# Patient Record
Sex: Female | Born: 1970 | Race: White | Hispanic: No | Smoking: Current some day smoker
Health system: Southern US, Community
[De-identification: ages and names within clinical notes are randomized; demographics above are authoritative.]

## PROBLEM LIST (undated history)

## (undated) DIAGNOSIS — Q775 Diastrophic dysplasia: Secondary | ICD-10-CM

## (undated) HISTORY — DX: Diastrophic dysplasia: Q77.5

## (undated) HISTORY — PX: LAMINECTOMY: SHX219

---

## 2013-03-26 ENCOUNTER — Other Ambulatory Visit (HOSPITAL_COMMUNITY): Payer: Self-pay | Admitting: Family Medicine

## 2013-03-26 ENCOUNTER — Ambulatory Visit (HOSPITAL_COMMUNITY)
Admission: RE | Admit: 2013-03-26 | Discharge: 2013-03-26 | Disposition: A | Discharge status: Home / Self Care | Payer: BC Managed Care – PPO | Source: Ambulatory Visit | Attending: Family Medicine | Admitting: Family Medicine

## 2013-03-26 DIAGNOSIS — G8929 Other chronic pain: Secondary | ICD-10-CM

## 2013-03-26 DIAGNOSIS — M25519 Pain in unspecified shoulder: Secondary | ICD-10-CM | POA: Insufficient documentation

## 2013-03-26 DIAGNOSIS — IMO0002 Reserved for concepts with insufficient information to code with codable children: Secondary | ICD-10-CM | POA: Insufficient documentation

## 2013-03-26 DIAGNOSIS — M25569 Pain in unspecified knee: Secondary | ICD-10-CM | POA: Insufficient documentation

## 2013-03-26 DIAGNOSIS — Q775 Diastrophic dysplasia: Secondary | ICD-10-CM

## 2013-03-26 DIAGNOSIS — M1710 Unilateral primary osteoarthritis, unspecified knee: Secondary | ICD-10-CM | POA: Insufficient documentation

## 2013-03-26 DIAGNOSIS — Q6589 Other specified congenital deformities of hip: Secondary | ICD-10-CM | POA: Insufficient documentation

## 2013-03-26 DIAGNOSIS — Z1231 Encounter for screening mammogram for malignant neoplasm of breast: Secondary | ICD-10-CM

## 2013-03-26 DIAGNOSIS — M19019 Primary osteoarthritis, unspecified shoulder: Secondary | ICD-10-CM | POA: Insufficient documentation

## 2013-03-26 DIAGNOSIS — M25559 Pain in unspecified hip: Secondary | ICD-10-CM | POA: Insufficient documentation

## 2013-05-17 ENCOUNTER — Ambulatory Visit (INDEPENDENT_AMBULATORY_CARE_PROVIDER_SITE_OTHER): Payer: BC Managed Care – PPO | Admitting: Orthopedic Surgery

## 2013-05-17 ENCOUNTER — Encounter: Payer: Self-pay | Admitting: Orthopedic Surgery

## 2013-05-17 VITALS — BP 108/85 | Ht <= 58 in | Wt 97.0 lb

## 2013-05-17 DIAGNOSIS — M719 Bursopathy, unspecified: Secondary | ICD-10-CM

## 2013-05-17 DIAGNOSIS — M87029 Idiopathic aseptic necrosis of unspecified humerus: Secondary | ICD-10-CM

## 2013-05-17 DIAGNOSIS — M19019 Primary osteoarthritis, unspecified shoulder: Secondary | ICD-10-CM | POA: Insufficient documentation

## 2013-05-17 DIAGNOSIS — M75101 Unspecified rotator cuff tear or rupture of right shoulder, not specified as traumatic: Secondary | ICD-10-CM | POA: Insufficient documentation

## 2013-05-17 DIAGNOSIS — Q788 Other specified osteochondrodysplasias: Secondary | ICD-10-CM

## 2013-05-17 DIAGNOSIS — M67919 Unspecified disorder of synovium and tendon, unspecified shoulder: Secondary | ICD-10-CM

## 2013-05-17 NOTE — Patient Instructions (Signed)

## 2013-05-17 NOTE — Progress Notes (Signed)
Patient ID: Lisa Bush, female   DOB: March 25, 1970, 43 y.o.   MRN: [id number]  Chief Complaint  Patient presents with  . Shoulder Pain    Right shoulder pain, no injury. Referred by Dr. 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 She does note that people handle her off and tried to pick her up and help her with mobility but she has no distinct trauma.. Pain out of 10 sharp constant chest trouble getting dressed other grooming activities and lifting her right arm. Her left shoulder range of motion is limited however by the condition. 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 appearance noted characteristic more for some of diastrophic organism. She is oriented x3 mood and affect are normal. Gait and station were not evaluated today. She is a scar in her lumbar spine increased lordosis.  Left shoulder limited flexion normal external rotation limited abduction even active and passive stability seemed normal. Strength normal skin normal pulse normal temperature normal sensation normal  Right shoulder the range of motion was similar but she had painful range of motion throughout the ARC of motion. No instability. Strength and muscle tone normal skin normal pulse normal temperature normal sensation normal. No palpable tenderness. Crepitance noted.  X-rays show  deformity of the humeral head with anteromedial articulation between the remaining humerus and glenoid with inferior spurring. Joint space narrowing consistent with glenohumeral arthritis.  Impression Encounter Diagnoses  Name Primary?  . Rotator cuff syndrome of right shoulder Yes  . Arthritis, shoulder region   . Epiphyseal dysplasia   . Avascular necrosis of humeral head     Shows his underlying shoulder arthritis most likely from avascular necrosis of the humeral head which I expect is an epiphyseal abnormality related to her disease. However she also probably has rotator cuff syndrome and we gave her a subacromial injection. Followup 3 weeks.  Shoulder Injection Procedure Note   Pre-operative Diagnosis: right  RC Syndrome  Post-operative Diagnosis: same  Indications: pain   Anesthesia: ethyl chloride   Procedure Details   Verbal consent was obtained for the procedure. The shoulder was prepped withalcohol and the skin was anesthetized. A 20 gauge needle was advanced into the subacromial space through posterior approach without difficulty  The space was then injected with 3 ml 1% lidocaine and 1 ml of depomedrol. The injection site was cleansed with isopropyl alcohol and a dressing was applied.  Complications:  None; patient tolerated the procedure well.

## 2013-05-21 ENCOUNTER — Other Ambulatory Visit (HOSPITAL_COMMUNITY): Payer: Self-pay | Admitting: Family Medicine

## 2013-05-21 ENCOUNTER — Ambulatory Visit (HOSPITAL_COMMUNITY): Payer: BC Managed Care – PPO

## 2013-05-21 DIAGNOSIS — M4800 Spinal stenosis, site unspecified: Secondary | ICD-10-CM

## 2013-05-25 ENCOUNTER — Encounter (HOSPITAL_COMMUNITY): Payer: Self-pay

## 2013-05-25 ENCOUNTER — Ambulatory Visit (HOSPITAL_COMMUNITY)
Admission: RE | Admit: 2013-05-25 | Discharge: 2013-05-25 | Disposition: A | Discharge status: Home / Self Care | Payer: BC Managed Care – PPO | Source: Ambulatory Visit | Attending: Family Medicine | Admitting: Family Medicine

## 2013-05-25 DIAGNOSIS — M5020 Other cervical disc displacement, unspecified cervical region: Secondary | ICD-10-CM | POA: Insufficient documentation

## 2013-05-25 DIAGNOSIS — M47812 Spondylosis without myelopathy or radiculopathy, cervical region: Secondary | ICD-10-CM | POA: Insufficient documentation

## 2013-05-25 DIAGNOSIS — M4800 Spinal stenosis, site unspecified: Secondary | ICD-10-CM

## 2013-06-05 ENCOUNTER — Ambulatory Visit (INDEPENDENT_AMBULATORY_CARE_PROVIDER_SITE_OTHER): Payer: BC Managed Care – PPO | Admitting: Orthopedic Surgery

## 2013-06-05 ENCOUNTER — Encounter: Payer: Self-pay | Admitting: Orthopedic Surgery

## 2013-06-05 VITALS — BP 108/84 | Ht <= 58 in | Wt 97.0 lb

## 2013-06-05 DIAGNOSIS — M67919 Unspecified disorder of synovium and tendon, unspecified shoulder: Secondary | ICD-10-CM

## 2013-06-05 DIAGNOSIS — M719 Bursopathy, unspecified: Principal | ICD-10-CM

## 2013-06-05 NOTE — Progress Notes (Signed)
Patient ID: Lisa Bush, female   DOB: 01/09/1971, 43 y.o.   MRN: [id number]  Chief Complaint  Patient presents with  . Follow-up    3 week recheck right shoulder bursitis    Right shoulder injection improved her range of motion and decreased her pain somewhat.  Still having some difficulty with certain activities such as reaching climbing  Cervical spine MRI done in May of this year shows spinal stenosis she will seek neurosurgical evaluation  Today review of systems otherwise no changes  She has for elevation where she can reach the top of her head she does have pain when reaching away from her body She has weakness in the shoulder but manual muscle testing reveals 5 minus over 5 supraspinatus  BP 108/84  Ht 3\' 6"  (1.067 m)  Wt 97 lb (43.999 kg)  BMI 38.65 kg/m2 General appearance is normal grooming, the patient is alert and oriented x3 with normal mood and affect.  Very difficult situation here because of her congenital abnormalities. We will try to treat her as long as we can nonoperatively and she needs surgery we would probably have to refer her because of the anesthetic issues  Continue now with physical therapy and if no improvement an MRI  Encounter Diagnosis  Name Primary?  . Disorders of bursae and tendons in shoulder region, unspecified Yes   Orders Placed This Encounter  Procedures  . Ambulatory referral to Physical Therapy    Referral Priority:  Routine    Referral Type:  Physical Medicine    Referral Reason:  Specialty Services Required    Requested Specialty:  Physical Therapy    Number of Visits Requested:  1

## 2013-06-05 NOTE — Patient Instructions (Addendum)
Call hospital to arrange PT  [facility]

## 2013-06-13 ENCOUNTER — Telehealth: Payer: Self-pay | Admitting: *Deleted

## 2013-06-13 ENCOUNTER — Other Ambulatory Visit: Payer: Self-pay | Admitting: *Deleted

## 2013-06-13 DIAGNOSIS — Q6589 Other specified congenital deformities of hip: Secondary | ICD-10-CM

## 2013-06-13 NOTE — Telephone Encounter (Signed)
Office notes and xray repots were faxed to [facility]. I spoke with Dara, and she advised me that they would have the doctor review Lisa Bush's records, and schedule her with the appropriate doctor for her condition. Lynwood Dawley stated they would contact the patient with the appointment date and time. I left message for the patient that referral was in the works, and to be expecting a call from [place].

## 2013-06-27 ENCOUNTER — Ambulatory Visit (HOSPITAL_COMMUNITY)
Admission: RE | Admit: 2013-06-27 | Discharge: 2013-06-27 | Disposition: A | Discharge status: Home / Self Care | Payer: BC Managed Care – PPO | Source: Ambulatory Visit | Attending: Orthopedic Surgery | Admitting: Orthopedic Surgery

## 2013-06-27 DIAGNOSIS — M25519 Pain in unspecified shoulder: Secondary | ICD-10-CM | POA: Insufficient documentation

## 2013-06-27 DIAGNOSIS — M25619 Stiffness of unspecified shoulder, not elsewhere classified: Secondary | ICD-10-CM | POA: Insufficient documentation

## 2013-06-27 DIAGNOSIS — IMO0001 Reserved for inherently not codable concepts without codable children: Secondary | ICD-10-CM | POA: Insufficient documentation

## 2013-06-27 DIAGNOSIS — M6281 Muscle weakness (generalized): Secondary | ICD-10-CM | POA: Insufficient documentation

## 2013-06-27 NOTE — Evaluation (Signed)
Occupational Therapy Evaluation  Patient Details  Name: Lisa Bush MRN: [id number] Date of Birth: 07/25/1970  Today's Date: 06/27/2013 Time: 1350-1430 OT Time Calculation (min): 40 min OT eval 1350-1430 40'  Visit#: 1 of 8  Re-eval: 07/30/13  Assessment Diagnosis: Right rotator cuff syndrome Prior Therapy: abour 4 weeks  Authorization:    Authorization Time Period:    Authorization Visit#:   of     Past Medical History:  Past Medical History  Diagnosis Date  . Diastrophic dysplasia    Past Surgical History:  Past Surgical History  Procedure Laterality Date  . Laminectomy      Diastrophic dwarfism spinal stenosis    Subjective Symptoms/Limitations Symptoms: S: The insurance won't pay for another CT scan unless I go through PT first. Pertinent History: Several years ago, patient's right shoulder began to have increased pain. No injury is noted. Due to patient's dwarfism, she is required to pull herself onto things or pull herself up so she is never able to rest her shoulder. Patient states she has bone spurs and bursitis in her right shoulder. Patient also states that her shoulder and jip joints are not like they shoulder be. Instead of being a ball and socket, the ball is more flat. Patient is visiting the [facility] surgeon tomorrow to schedule a laminectomy for her spinal stenosis. Dr. Romeo Apple has referred patient to occupational therapy for evaluation and treatment.   Limitations: Reaching the top of her head and getting dressed.  Special Tests: FOTO score: 35/100 Patient Stated Goals: To move her arm better. Pain Assessment Currently in Pain?: Yes Pain Score: 3  (with movement pain is 9/10) Pain Location: Shoulder Pain Orientation: Right Pain Type: Acute pain  Precautions/Restrictions  Precautions Precautions: Other (comment) Precaution Comments: dwarfism Restrictions Weight Bearing Restrictions: No  Balance Screening Balance Screen Has the patient fallen in the  past 6 months: No  Prior Functioning  Home Living Family/patient expects to be discharged to:: Private residence Living Arrangements: Non-relatives/Friends Available Help at Discharge: Friend(s) Type of Home: House Home Equipment: Art gallery manager Prior Function Level of Independence: Independent with basic ADLs;Independent with gait Driving: No Vocation: Full time employment Vocation Requirements: comedian  Assessment ADL/Vision/Perception ADL ADL Comments: Difficulty getting shirt on, reaching top of head Dominant Hand: Right Vision - History Baseline Vision: Wears glasses all the time  Cognition/Observation Cognition Overall Cognitive Status: Within Functional Limits for tasks assessed Arousal/Alertness: Awake/alert Orientation Level: Oriented X4   Additional Assessments RUE Assessment RUE Assessment:  (assessed seated.IR/ER adducted) RUE AROM (degrees) Right Shoulder Flexion: 95 Degrees Right Shoulder ABduction: 65 Degrees Right Shoulder Internal Rotation: 80 Degrees Right Shoulder External Rotation: 64 Degrees RUE Strength Right Shoulder Flexion:  (4+/5) Right Shoulder ABduction:  (4+/5) Right Shoulder Internal Rotation:  (4+/5) Right Shoulder External Rotation:  (4+/5) Palpation Palpation: max fascial restrictions in right upper arm, trapezius, and scapularis region.       Occupational Therapy Assessment and Plan OT Assessment and Plan Clinical Impression Statement: A: Patient is a 43 y/o female presenting to occupational therapy with right rotator cuff syndrome resulting in increased pain, fascial restrictions and decreased AROM and strength causing difficulty completing BADL and work related tasks.  Pt will benefit from skilled therapeutic intervention in order to improve on the following deficits: Increased fascial restricitons;Impaired UE functional use;Decreased strength;Decreased range of motion Rehab Potential: Good OT Frequency: Min 2X/week OT  Duration: 4 weeks OT Treatment/Interventions: Self-care/ADL training;Therapeutic exercise;Manual therapy;Modalities;Patient/family education OT Plan: P: Pt will benefit from skilled OT services  to decrease pain, improve ROM, decrease fascial restictions, increase strength, and improved RUE functional use.     Treatment Plan: PROM, AAROM, AROM, proximal shoulder strengthening, scapular stabilization, general strengthening, HEP, myofascial release (MFR)     Goals Short Term Goals Time to Complete Short Term Goals: 2 weeks Short Term Goal 1: Patient will be educated on HEP.  Short Term Goal 2: Patient will [facility] to [facility] to increase ability to donn shirts and dresses with less difficulty.  Short Term Goal 3: Patient will report a pain level of 6/10 with daily tasks.  Short Term Goal 4: Patient will decrease fascial restriction from max to mod amount.  Long Term Goals Time to Complete Long Term Goals: 4 weeks Long Term Goal 1: Patient will return to highest level of independence with all BADL and work tasks.  Long Term Goal 2: Patient will increase AROM to WNL to increase ability to reach hand to top of head with less difficulty.  Long Term Goal 3: Patient will report a pain level of 3/10 or less with daily tasks.  Long Term Goal 4: Patient will decrease fascial restrictions from mod to trace.  Long Term Goal 5: Patient will increase Rt shoulder strength to 5/5 to increase ability to pull self into cars etc with less difficulty.    Problem List Patient Active Problem List   Diagnosis Date Noted  . Pain in joint, shoulder region 06/27/2013  . Muscle weakness (generalized) 06/27/2013  . 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 Avascular necrosis of humeral head 05/17/2013  . 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 Arthritis, shoulder region 05/17/2013  . Rotator cuff syndrome of right shoulder 05/17/2013    End of Session Activity Tolerance: Patient  tolerated treatment well General Behavior During Therapy: [facility] for tasks assessed/performed OT Plan of Care OT Home Exercise Plan: shoulder stretches OT Patient Instructions: handout (scanned) Consulted and Agree with Plan of Care: Patient;Other (Comment) (friend: Caryn BeeKevin)   Limmie PatriciaLaura Essenmacher, OTR/L,CBIS   06/27/2013, 3:04 PM  Physician Documentation Your signature is required to indicate approval of the treatment plan as stated above.  Please sign and either send electronically or make a copy of this report for your files and return this physician signed original.  Please mark one 1.__approve of plan  2. ___approve of plan with the following conditions.   ______________________________                                                          _____________________ Physician Signature                                                                                                             Date sbnr

## 2013-07-02 ENCOUNTER — Ambulatory Visit (HOSPITAL_COMMUNITY)
Admission: RE | Admit: 2013-07-02 | Discharge: 2013-07-02 | Disposition: A | Discharge status: Home / Self Care | Payer: BC Managed Care – PPO | Source: Ambulatory Visit | Attending: Family Medicine | Admitting: Family Medicine

## 2013-07-02 NOTE — Progress Notes (Signed)
Occupational Therapy Treatment Patient Details  Name: Lisa Bush MRN: [id number] Date of Birth: 1970/08/27  Today's Date: 07/02/2013 Time: 3664-4034 OT Time Calculation (min): 38 min MFR 7425-9563  16' Therex 8756-4332 22'  Visit#: 2 of 8  Re-eval: 07/30/13    Authorization:    Authorization Time Period:    Authorization Visit#:   of    Subjective Symptoms/Limitations Symptoms: S: My shoulder was sore for at least two days after that first visit.  Pain Assessment Currently in Pain?: Yes Pain Score: 7  Pain Location: Shoulder Pain Orientation: Right Pain Type: Acute pain  Precautions/Restrictions  Precautions Precautions: Other (comment) Precaution Comments: dwarfism  Exercise/Treatments Supine Protraction: PROM;10 reps;AROM;12 reps Horizontal ABduction: PROM;10 reps;AROM;12 reps External Rotation: PROM;10 reps;AROM;12 reps Internal Rotation: PROM;10 reps;AROM;12 reps Flexion: PROM;10 reps;AROM;12 reps ABduction: PROM;10 reps;AROM;12 reps Seated Elevation: AROM;12 reps Extension: AROM;12 reps Row: AROM;12 reps Protraction: AROM;12 reps Horizontal ABduction: AROM;12 reps External Rotation: AROM;12 reps Internal Rotation: AROM;12 reps Flexion: AROM;12 reps Abduction: AROM;12 reps ROM / Strengthening / Isometric Strengthening Proximal Shoulder Strengthening, Supine: 12X Proximal Shoulder Strengthening, Seated: 12X Prot/Ret//Elev/Dep: 1'        Manual Therapy Manual Therapy: Myofascial release Myofascial Release: Myofascial release and manual stretching to right upper arm, trapezius, and scapularis region to decrease fascial restrictions and increase joint mobility in a pain free zone.   Occupational Therapy Assessment and Plan OT Assessment and Plan Clinical Impression Statement: A: Patient completed AROM supine and seated. Pt completed in good form for patient's anatomy. Pain felt at end of stretch. Patient given AROM exercises to add to HEP. OT Plan:  P: Add Wall wash   Goals Short Term Goals Time to Complete Short Term Goals: 2 weeks Short Term Goal 1: Patient will be educated on HEP.  Short Term Goal 1 Progress: Progressing toward goal Short Term Goal 2: Patient will increase PROM to [facility] to increase ability to donn shirts and dresses with less difficulty.  Short Term Goal 2 Progress: Progressing toward goal Short Term Goal 3: Patient will report a pain level of 6/10 with daily tasks.  Short Term Goal 3 Progress: Progressing toward goal Short Term Goal 4: Patient will decrease fascial restriction from max to mod amount.  Short Term Goal 4 Progress: Progressing toward goal Long Term Goals Time to Complete Long Term Goals: 4 weeks Long Term Goal 1: Patient will return to highest level of independence with all BADL and work tasks.  Long Term Goal 1 Progress: Progressing toward goal Long Term Goal 2: Patient will increase AROM to WNL to increase ability to reach hand to top of head with less difficulty.  Long Term Goal 2 Progress: Progressing toward goal Long Term Goal 3: Patient will report a pain level of 3/10 or less with daily tasks.  Long Term Goal 3 Progress: Progressing toward goal Long Term Goal 4: Patient will decrease fascial restrictions from mod to trace.  Long Term Goal 4 Progress: Progressing toward goal Long Term Goal 5: Patient will increase Rt shoulder strength to 5/5 to increase ability to pull self into cars etc with less difficulty.   Long Term Goal 5 Progress: Progressing toward goal  Problem List Patient Active Problem List   Diagnosis Date Noted  . Pain in joint, shoulder region 06/27/2013  . Muscle weakness (generalized) 06/27/2013  . 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 Avascular necrosis of humeral head 05/17/2013  . 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 Arthritis, shoulder region 05/17/2013  .  Rotator cuff syndrome of right shoulder 05/17/2013    End of  Session Activity Tolerance: Patient tolerated treatment well General Behavior During Therapy: [facility] for tasks assessed/performed OT Plan of Care OT Home Exercise Plan: AROM exercises OT Patient Instructions: handout (scanned) Consulted and Agree with Plan of Care: Patient (friend: Lennette Bihari)   Ailene Ravel, OTR/L,CBIS   07/02/2013, 3:19 PM

## 2013-07-06 ENCOUNTER — Ambulatory Visit (HOSPITAL_COMMUNITY)
Admission: RE | Admit: 2013-07-06 | Discharge: 2013-07-06 | Disposition: A | Discharge status: Home / Self Care | Payer: BC Managed Care – PPO | Source: Ambulatory Visit | Attending: Family Medicine | Admitting: Family Medicine

## 2013-07-06 NOTE — Progress Notes (Signed)
Occupational Therapy Treatment Patient Details  Name: Aris Georgiaanya Daher MRN: [id number] Date of Birth: [birth year]  Today's Date: 07/06/2013 Time: [id number] OT Time Calculation (min): 45 min Manual 1103-1130 (25') Therapeutic Exercises 1130-1148 (23')  Visit#: 3 of 8  Re-eval: 07/30/13    Authorization:    Authorization Time Period:    Authorization Visit#:   of    Subjective Symptoms/Limitations Symptoms: "As longa s I don't move I'm fine.  And they way I get myself into the car - I've got to pull myself in" Pain Assessment Currently in Pain?: Yes Pain Score: 7  Pain Location: Shoulder Pain Orientation: Right Pain Type: Acute pain  Exercise/Treatments Supine Protraction: PROM;10 reps;AROM;12 reps Horizontal ABduction: PROM;10 reps;AROM;12 reps External Rotation: PROM;10 reps;AROM;12 reps Internal Rotation: PROM;10 reps;AROM;12 reps Flexion: PROM;10 reps;AROM;12 reps ABduction: PROM;10 reps;AROM;12 reps Seated Elevation: AROM;12 reps Extension: AROM;12 reps Row: AROM;12 reps Protraction: AROM;12 reps Horizontal ABduction: AROM;12 reps External Rotation: AROM;12 reps Internal Rotation: AROM;12 reps Flexion: AROM;12 reps Abduction: AROM;12 reps  ROM / Strengthening / Isometric Strengthening Wall Wash: 1 min Proximal Shoulder Strengthening, Supine: 12X Proximal Shoulder Strengthening, Seated: 12X   Manual Therapy Manual Therapy: Myofascial release Myofascial Release: Myofascial release and manual stretching to right upper arm, trapezius, and scapularis region to decrease fascial restrictions and increase joint mobility in a pain free zone.    Occupational Therapy Assessment and Plan OT Assessment and Plan Clinical Impression Statement: Pt completed AROM in supine and seated with good form.  Pt verbalized feeling looswer duirng seated AROM this session - right and left hsoulder flexion appeared near equal this session.  Added wall wash this session (pt sitting on  stepstool at wall) - pt complted with good form, but was fatigued.  Pt verbalizes feeling sore after each therpay session so far. OT Plan: Increase supine proximal shoulder strengthening reps. Attempt thumb tacks.   Goals Short Term Goals Short Term Goal 1: Patient will be educated on HEP.  Short Term Goal 1 Progress: Progressing toward goal Short Term Goal 2: Patient will increase PROM to [facility] to increase ability to donn shirts and dresses with less difficulty.  Short Term Goal 2 Progress: Progressing toward goal Short Term Goal 3: Patient will report a pain level of 6/10 with daily tasks.  Short Term Goal 3 Progress: Progressing toward goal Short Term Goal 4: Patient will decrease fascial restriction from max to mod amount.  Short Term Goal 4 Progress: Progressing toward goal Long Term Goals Long Term Goal 1: Patient will return to highest level of independence with all BADL and work tasks.  Long Term Goal 1 Progress: Progressing toward goal Long Term Goal 2: Patient will increase AROM to WNL to increase ability to reach hand to top of head with less difficulty.  Long Term Goal 2 Progress: Progressing toward goal Long Term Goal 3: Patient will report a pain level of 3/10 or less with daily tasks.  Long Term Goal 3 Progress: Progressing toward goal Long Term Goal 4: Patient will decrease fascial restrictions from mod to trace.  Long Term Goal 4 Progress: Progressing toward goal Long Term Goal 5: Patient will increase Rt shoulder strength to 5/5 to increase ability to pull self into cars etc with less difficulty.   Long Term Goal 5 Progress: Progressing toward goal  Problem List Patient Active Problem List   Diagnosis Date Noted  . Pain in joint, shoulder region 06/27/2013  . Muscle weakness (generalized) 06/27/2013  . Disorders of bursae and tendons in shoulder  region, unspecified 06/05/2013  . Avascular necrosis of humeral head 05/17/2013  . 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  Arthritis, shoulder region 05/17/2013  . Rotator cuff syndrome of right shoulder 05/17/2013    End of Session Activity Tolerance: Patient tolerated treatment well General Behavior During Therapy: [facility] for tasks assessed/performed  GO    Marry GuanMarie Rawlings, MS, OTR/L [phone]  07/06/2013, 11:53 AM

## 2013-07-09 ENCOUNTER — Ambulatory Visit (HOSPITAL_COMMUNITY)
Admission: RE | Admit: 2013-07-09 | Discharge: 2013-07-09 | Disposition: A | Discharge status: Home / Self Care | Payer: BC Managed Care – PPO | Source: Ambulatory Visit | Attending: Orthopedic Surgery | Admitting: Orthopedic Surgery

## 2013-07-09 NOTE — Progress Notes (Signed)
Occupational Therapy Treatment Patient Details  Name: Lisa Bush MRN: [id number] Date of Birth: 1970/03/29  Today's Date: 07/09/2013 Time: 7829-56211436-1522 OT Time Calculation (min): 46 min Manual 1436-1451 (15') Therapeutic Exercises 1451-1514 (23') Heat pack 1514 - 1522 (8')  Visit#: 4 of 8  Re-eval: 07/30/13    Authorization:    Authorization Time Period:    Authorization Visit#:   of    Subjective Symptoms/Limitations Symptoms: "I fell on Friday, right onto that shoulder. I tried to vacuum earlier, too, and that always causes pain." Pain Assessment Currently in Pain?: Yes Pain Score: 8  Pain Location: Shoulder Pain Orientation: Right Pain Type: Acute pain  Precautions/Restrictions     Exercise/Treatments Supine Protraction: PROM;10 reps;AROM;15 reps Horizontal ABduction: PROM;10 reps;AROM;15 reps External Rotation: PROM;10 reps;AROM;15 reps Internal Rotation: PROM;10 reps;AROM;15 reps Flexion: PROM;10 reps;AROM;15 reps ABduction: PROM;10 reps;AROM;15 reps Seated Elevation: AROM;15 reps Extension: AROM;15 reps Row: AROM;15 reps Protraction: AROM;12 reps Horizontal ABduction: AROM;12 reps External Rotation: AROM;12 reps Internal Rotation: AROM;12 reps Flexion: AROM;12 reps Abduction: AROM;12 reps   ROM / Strengthening / Isometric Strengthening Wall Wash: 1 min Thumb Tacks: 1 min Proximal Shoulder Strengthening, Supine: 15x   Modalities Modalities: Moist Heat Manual Therapy Manual Therapy: Myofascial release Myofascial Release: Myofascial release and manual stretching to right upper arm, trapezius, and scapularis region to decrease fascial restrictions and increase joint mobility in a pain free zone.   Moist Heat Therapy Number Minutes Moist Heat: 8 Minutes Moist Heat Location: Shoulder  Occupational Therapy Assessment and Plan OT Assessment and Plan Clinical Impression Statement: Increased pain overall this session due to fall on Friday, increased  difficulty with supine abduction this session. increase supine reps, with good tolerance.  Added thumbtacks and pt tolerated well. Seated exercises completed sitting on mat or on stool. OT Plan: Attempt x-v arms.   Goals Short Term Goals Short Term Goal 1: Patient will be educated on HEP.  Short Term Goal 1 Progress: Progressing toward goal Short Term Goal 2: Patient will increase PROM to [facility] to increase ability to donn shirts and dresses with less difficulty.  Short Term Goal 2 Progress: Progressing toward goal Short Term Goal 3: Patient will report a pain level of 6/10 with daily tasks.  Short Term Goal 3 Progress: Progressing toward goal Short Term Goal 4: Patient will decrease fascial restriction from max to mod amount.  Short Term Goal 4 Progress: Progressing toward goal Long Term Goals Long Term Goal 1: Patient will return to highest level of independence with all BADL and work tasks.  Long Term Goal 1 Progress: Progressing toward goal Long Term Goal 2: Patient will increase AROM to WNL to increase ability to reach hand to top of head with less difficulty.  Long Term Goal 2 Progress: Progressing toward goal Long Term Goal 3: Patient will report a pain level of 3/10 or less with daily tasks.  Long Term Goal 3 Progress: Progressing toward goal Long Term Goal 4: Patient will decrease fascial restrictions from mod to trace.  Long Term Goal 4 Progress: Progressing toward goal Long Term Goal 5: Patient will increase Rt shoulder strength to 5/5 to increase ability to pull self into cars etc with less difficulty.   Long Term Goal 5 Progress: Progressing toward goal  Problem List Patient Active Problem List   Diagnosis Date Noted  . Pain in joint, shoulder region 06/27/2013  . Muscle weakness (generalized) 06/27/2013  . 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 Avascular necrosis of  humeral head 05/17/2013  . 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  Arthritis, shoulder region 05/17/2013  . Rotator cuff syndrome of right shoulder 05/17/2013    End of Session Activity Tolerance: Patient tolerated treatment well General Behavior During Therapy: [facility] for tasks assessed/performed  GO   Marry GuanMarie Rawlings, MS, OTR/L [phone] 07/09/2013, 4:27 PM

## 2013-07-12 ENCOUNTER — Ambulatory Visit (HOSPITAL_COMMUNITY)
Admission: RE | Admit: 2013-07-12 | Discharge: 2013-07-12 | Disposition: A | Discharge status: Home / Self Care | Payer: BC Managed Care – PPO | Source: Ambulatory Visit | Attending: Family Medicine | Admitting: Family Medicine

## 2013-07-12 NOTE — Progress Notes (Signed)
Occupational Therapy Treatment Patient Details  Name: Lisa Bush MRN: [id number] Date of Birth: [birth year]  Today's Date: 07/12/2013 Time: 1022-1105 OT Time Calculation (min): 43 min Manual 1022-1032 (10') Therapeutic Exercises 1032-1057 (25') Heat Pack 1057-1105 (8')  Visit#: 6 of 8  Re-eval: 07/30/13    Authorization:    Authorization Time Period:    Authorization Visit#:   of    Subjective Symptoms/Limitations Symptoms: "I just have to give bloodwork, and that's very strussful for me." Pain Assessment Currently in Pain?: Yes Pain Score: 5  Pain Location: Shoulder Pain Orientation: Right Pain Type: Acute pain  Exercise/Treatments Supine Protraction: PROM;10 reps;AROM;15 reps Horizontal ABduction: PROM;10 reps;AROM;15 reps External Rotation: PROM;10 reps;AROM;15 reps Internal Rotation: PROM;10 reps;AROM;15 reps Flexion: PROM;10 reps;AROM;15 reps ABduction: PROM;10 reps;AROM;15 reps Seated Elevation: AROM;15 reps Extension: AROM;15 reps Row: AROM;15 reps Protraction: AROM;12 reps Horizontal ABduction: AROM;12 reps External Rotation: AROM;12 reps Internal Rotation: AROM;12 reps Flexion: AROM;12 reps Abduction: AROM;12 reps ROM / Strengthening / Isometric Strengthening Wall Wash: 1 min Thumb Tacks: 1 min X to V Arms: 10x Proximal Shoulder Strengthening, Supine: 15x Proximal Shoulder Strengthening, Seated: 12X Prot/Ret//Elev/Dep: 1'    Modalities Modalities: Moist Heat Manual Therapy Manual Therapy: Myofascial release Myofascial Release: Myofascial release and manual stretching to right upper arm, trapezius, and scapularis region to decrease fascial restrictions and increase joint mobility in a pain free zone.   Moist Heat Therapy Number Minutes Moist Heat: 8 Minutes Moist Heat Location: Shoulder  Occupational Therapy Assessment and Plan OT Assessment and Plan Clinical Impression Statement: Decreased pain in shoulder this session.  Added x-v arms, with  good tolerance.  Continued difficulty with shoulder abduction.  Pt verbalized increased relief with heatpack at end of previous session, nand requested again this session. Seated exercises completed sitting on mat or on stool. Pt verbalizes decreased discomfort with supine exercises. OT Plan: Add weight to supine AROM   Goals Short Term Goals Short Term Goal 1: Patient will be educated on HEP.  Short Term Goal 1 Progress: Progressing toward goal Short Term Goal 2: Patient will increase PROM to [facility] to increase ability to donn shirts and dresses with less difficulty.  Short Term Goal 2 Progress: Progressing toward goal Short Term Goal 3: Patient will report a pain level of 6/10 with daily tasks.  Short Term Goal 3 Progress: Progressing toward goal Short Term Goal 4: Patient will decrease fascial restriction from max to mod amount.  Short Term Goal 4 Progress: Progressing toward goal Long Term Goals Long Term Goal 1: Patient will return to highest level of independence with all BADL and work tasks.  Long Term Goal 1 Progress: Progressing toward goal Long Term Goal 2: Patient will increase AROM to WNL to increase ability to reach hand to top of head with less difficulty.  Long Term Goal 2 Progress: Progressing toward goal Long Term Goal 3: Patient will report a pain level of 3/10 or less with daily tasks.  Long Term Goal 3 Progress: Progressing toward goal Long Term Goal 4: Patient will decrease fascial restrictions from mod to trace.  Long Term Goal 4 Progress: Progressing toward goal Long Term Goal 5: Patient will increase Rt shoulder strength to 5/5 to increase ability to pull self into cars etc with less difficulty.   Long Term Goal 5 Progress: Progressing toward goal  Problem List Patient Active Problem List   Diagnosis Date Noted  . Pain in joint, shoulder region 06/27/2013  . Muscle weakness (generalized) 06/27/2013  . Disorders of bursae  and tendons in shoulder region, unspecified  06/05/2013  . Avascular necrosis of humeral head 05/17/2013  . 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 Arthritis, shoulder region 05/17/2013  . Rotator cuff syndrome of right shoulder 05/17/2013    End of Session Activity Tolerance: Patient tolerated treatment well General Behavior During Therapy: [facility] for tasks assessed/performed  GO   Bea Graff, MS, OTR/L [phone]  07/12/2013, 12:13 PM

## 2013-07-16 ENCOUNTER — Ambulatory Visit (HOSPITAL_COMMUNITY)
Admission: RE | Admit: 2013-07-16 | Discharge: 2013-07-16 | Disposition: A | Discharge status: Home / Self Care | Payer: BC Managed Care – PPO | Source: Ambulatory Visit | Attending: Family Medicine | Admitting: Family Medicine

## 2013-07-16 ENCOUNTER — Encounter: Payer: Self-pay | Admitting: Orthopedic Surgery

## 2013-07-16 NOTE — Progress Notes (Signed)
Occupational Therapy Treatment Patient Details  Name: Lisa Bush MRN: [id number] Date of Birth: 31-Dec-1970  Today's Date: 07/16/2013 Time: 4098-11911435-1517 OT Time Calculation (min): 42 min Manual 1435-1455 (20') Therapeutic Exercises 1455-1509 (14') Moist Heat pack [id number] (8')  Visit#: 7 of 8  Re-eval: 07/30/13    Authorization:    Authorization Time Period:    Authorization Visit#:   of    Subjective Symptoms/Limitations Symptoms: "A little bit, because I've been in and out of the car today. and doing laundry, i think the more active I am the more my pain is." Pain Assessment Currently in Pain?: Yes Pain Score: 7  Pain Location: Shoulder Pain Orientation: Right Pain Type: Acute pain  Precautions/Restrictions     Exercise/Treatments Supine Protraction: PROM;Strengthening;10 reps;Weights Protraction Weight (lbs): 1 Horizontal ABduction: PROM;Strengthening;10 reps;Weights Horizontal ABduction Weight (lbs): 1 External Rotation: PROM;Strengthening;10 reps;Weights External Rotation Weight (lbs): 1 Internal Rotation: PROM;Strengthening;10 reps;Weights Internal Rotation Weight (lbs): 1 Flexion: PROM;Strengthening;10 reps;Weights Shoulder Flexion Weight (lbs): 1 ABduction: PROM;Strengthening;10 reps;Weights Shoulder ABduction Weight (lbs): 1 Seated Elevation: AROM;15 reps Extension: Theraband;10 reps Theraband Level (Shoulder Extension): Level 2 (Red)   ROM / Strengthening / Isometric Strengthening Wall Wash: 1 min Thumb Tacks: 1 min Proximal Shoulder Strengthening, Supine: 10x with 1# weight   Modalities Modalities: Moist Heat Manual Therapy Manual Therapy: Myofascial release Myofascial Release: Myofascial release and manual stretching to right upper arm, trapezius, and scapularis region to decrease fascial restrictions and increase joint mobility in a pain free zone.   Moist Heat Therapy Moist Heat Location: Shoulder  Occupational Therapy Assessment and  Plan OT Assessment and Plan Clinical Impression Statement: Increased pain in shoulder region this session - some relief with MFR and applied moist heat pack at end of session.  Added 1# to supine exercises and pt tolerated well, but with increased 'grinding' in R shoulder, especially with abduction and horizontal abduction.  OT Plan: Update HEP prior to vacation (add theraband extension and retraction).  Re-Eval on return from vacation (7/13).   Goals Short Term Goals Short Term Goal 1: Patient will be educated on HEP.  Short Term Goal 1 Progress: Progressing toward goal Short Term Goal 2: Patient will increase PROM to [facility] to increase ability to donn shirts and dresses with less difficulty.  Short Term Goal 2 Progress: Progressing toward goal Short Term Goal 3: Patient will report a pain level of 6/10 with daily tasks.  Short Term Goal 3 Progress: Progressing toward goal Short Term Goal 4: Patient will decrease fascial restriction from max to mod amount.  Short Term Goal 4 Progress: Progressing toward goal Long Term Goals Long Term Goal 1: Patient will return to highest level of independence with all BADL and work tasks.  Long Term Goal 1 Progress: Progressing toward goal Long Term Goal 2: Patient will increase AROM to WNL to increase ability to reach hand to top of head with less difficulty.  Long Term Goal 2 Progress: Progressing toward goal Long Term Goal 3: Patient will report a pain level of 3/10 or less with daily tasks.  Long Term Goal 3 Progress: Progressing toward goal Long Term Goal 4: Patient will decrease fascial restrictions from mod to trace.  Long Term Goal 4 Progress: Progressing toward goal Long Term Goal 5: Patient will increase Rt shoulder strength to 5/5 to increase ability to pull self into cars etc with less difficulty.   Long Term Goal 5 Progress: Progressing toward goal  Problem List Patient Active Problem List   Diagnosis Date Noted  .  Pain in joint, shoulder  region 06/27/2013  . Muscle weakness (generalized) 06/27/2013  . 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 Avascular necrosis of humeral head 05/17/2013  . 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 Arthritis, shoulder region 05/17/2013  . Rotator cuff syndrome of right shoulder 05/17/2013    End of Session Activity Tolerance: Patient tolerated treatment well General Behavior During Therapy: [facility] for tasks assessed/performed  GO    Marry GuanMarie Rawlings, MS, OTR/L [phone]  07/16/2013, 3:21 PM

## 2013-07-19 ENCOUNTER — Ambulatory Visit (HOSPITAL_COMMUNITY)
Admission: RE | Admit: 2013-07-19 | Discharge: 2013-07-19 | Disposition: A | Discharge status: Home / Self Care | Payer: BC Managed Care – PPO | Source: Ambulatory Visit | Attending: Orthopedic Surgery | Admitting: Orthopedic Surgery

## 2013-07-19 DIAGNOSIS — M25619 Stiffness of unspecified shoulder, not elsewhere classified: Secondary | ICD-10-CM | POA: Insufficient documentation

## 2013-07-19 DIAGNOSIS — M25519 Pain in unspecified shoulder: Secondary | ICD-10-CM | POA: Insufficient documentation

## 2013-07-19 DIAGNOSIS — IMO0001 Reserved for inherently not codable concepts without codable children: Secondary | ICD-10-CM | POA: Insufficient documentation

## 2013-07-19 DIAGNOSIS — M6281 Muscle weakness (generalized): Secondary | ICD-10-CM | POA: Insufficient documentation

## 2013-07-19 NOTE — Progress Notes (Addendum)
Occupational Therapy Treatment Patient Details  Name: Lisa Bush MRN: [id number] Date of Birth: [birth year]  Today's Date: 07/19/2013 Time: [id number] OT Time Calculation (min): 36 min MFR [id number]  13' Therex [id number] 23'   Visit#: 8 of 8  Re-eval: 07/30/13     Subjective Symptoms/Limitations Symptoms: S: [place].  Pain Assessment Currently in Pain?: Yes Pain Score: 7  Pain Location: Shoulder Pain Orientation: Right Pain Type: Acute pain  Precautions/Restrictions  Precautions Precautions: Other (comment) Precaution Comments: dwarfism  Exercise/Treatments Supine Protraction: PROM;5 reps;Strengthening;12 reps Protraction Weight (lbs): 1 Horizontal ABduction: PROM;5 reps;Strengthening;12 reps Horizontal ABduction Weight (lbs): 1 External Rotation: PROM;5 reps;Strengthening;12 reps External Rotation Weight (lbs): 1 Internal Rotation: PROM;5 reps;Strengthening;12 reps Internal Rotation Weight (lbs): 1 Flexion: PROM;5 reps;Strengthening;12 reps Shoulder Flexion Weight (lbs): 1 ABduction: PROM;5 reps;Strengthening;10 reps Shoulder ABduction Weight (lbs): 1 Seated Extension: Theraband;10 reps Theraband Level (Shoulder Extension): Level 2 (Red) Retraction: Theraband;10 reps Theraband Level (Shoulder Retraction): Level 2 (Red) Row: Theraband;10 reps Theraband Level (Shoulder Row): Level 2 (Red) ROM / Strengthening / Isometric Strengthening Proximal Shoulder Strengthening, Supine: 10x with 1# weight       Manual Therapy Manual Therapy: Myofascial release Myofascial Release: Myofascial release and manual stretching to right upper arm, trapezius, and scapularis region to decrease fascial restrictions and increase joint mobility in a pain free zone.  Occupational Therapy Assessment and Plan OT Assessment and Plan Clinical Impression Statement: A: Patient experienced shoulder "popping" sensation during horizontal  abduction during passive stretching. Patient was given theraband exercises to add to HEP.  OT Plan: P: Re-Eval on return from vacation (7/13).   Goals Short Term Goals Short Term Goal 1: Patient will be educated on HEP.  Short Term Goal 2: Patient will increase PROM to [facility] to increase ability to donn shirts and dresses with less difficulty.  Short Term Goal 3: Patient will report a pain level of 6/10 with daily tasks.  Short Term Goal 4: Patient will decrease fascial restriction from max to mod amount.  Long Term Goals Long Term Goal 1: Patient will return to highest level of independence with all BADL and work tasks.  Long Term Goal 2: Patient will increase AROM to WNL to increase ability to reach hand to top of head with less difficulty.  Long Term Goal 3: Patient will report a pain level of 3/10 or less with daily tasks.  Long Term Goal 4: Patient will decrease fascial restrictions from mod to trace.  Long Term Goal 5: Patient will increase Rt shoulder strength to 5/5 to increase ability to pull self into cars etc with less difficulty.    Problem List Patient Active Problem List   Diagnosis Date Noted  . Pain in joint, shoulder region 06/27/2013  . Muscle weakness (generalized) 06/27/2013  . 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 Avascular necrosis of humeral head 05/17/2013  . 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 Arthritis, shoulder region 05/17/2013  . Rotator cuff syndrome of right shoulder 05/17/2013    End of Session Activity Tolerance: Patient tolerated treatment well General Behavior During Therapy: [facility] for tasks assessed/performed OT Plan of Care OT Home Exercise Plan: red theraband exercises OT Patient Instructions: handout (scanned) Consulted and Agree with Plan of Care: Patient   Limmie PatriciaLaura Celester Lech, OTR/L,CBIS   07/19/2013, 4:16 PM

## 2013-07-24 ENCOUNTER — Telehealth: Payer: Self-pay | Admitting: *Deleted

## 2013-07-24 NOTE — Telephone Encounter (Signed)
Patient [facility] my [facility] She stated that she did receive a call from them, however, her boyfriend took the message and did not get all the information. She has not [facility] to [facility] again. I advised that she try to contact them, and that they should have her notes that we faxed back in May. She stated she called them and they advised that we fax the notes again since it had been so [place]. [facility] on 07/24/13.

## 2013-07-30 ENCOUNTER — Ambulatory Visit (HOSPITAL_COMMUNITY)
Admission: RE | Admit: 2013-07-30 | Discharge: 2013-07-30 | Disposition: A | Discharge status: Home / Self Care | Payer: BC Managed Care – PPO | Source: Ambulatory Visit | Attending: Family Medicine | Admitting: Family Medicine

## 2013-07-30 NOTE — Evaluation (Signed)
Occupational Therapy Reassessment and Discharge Summary  Patient Details  Name: Lisa Bush MRN: [id number] Date of Birth: 02-03-1970  Today's Date: 07/30/2013 Time: 1026-1100 OT Time Calculation (min): 34 min Manual therapy 1026-1040 14' ROM/MMT 1040- 1100 20' Visit#: 9 of 9  Re-eval: 07/30/13  Assessment Diagnosis: Right rotator cuff syndrome  Authorization:    Authorization Time Period:    Authorization Visit#:   of     Past Medical History:  Past Medical History  Diagnosis Date  . Diastrophic dysplasia    Past Surgical History:  Past Surgical History  Procedure Laterality Date  . Laminectomy      Diastrophic dwarfism spinal stenosis    Subjective S:  using my scooter really irritates my shoulder.  Special Tests: FOTO is 65 and was 35 one month ago. Pain Assessment Currently in Pain?: Yes Pain Score: 7  Pain Location: Shoulder Pain Orientation: Right Pain Type: Acute pain   Assessment  Additional Assessments RUE AROM (degrees) RUE Overall AROM Comments: assessed in seated Er/IR with shoulder adducted (06/27/13 seated AROM ER/IR with shoulder adducted Right Shoulder Flexion: 86 Degrees (95) Right Shoulder ABduction: 88 Degrees (65) Right Shoulder Internal Rotation: 70 Degrees (80) Right Shoulder External Rotation: 38 Degrees (64) RUE Strength Right Shoulder Flexion:  (4+/5 (4+/5)) Right Shoulder ABduction:  (4+/5 (4+/5)) Right Shoulder Internal Rotation:  (4+/5 (4+/5)) Right Shoulder External Rotation:  (4+/5 (4+/5))     Exercise/Treatments    Manual Therapy Manual Therapy: Myofascial release Myofascial Release: Myofascial release and manual stretching to right upper arm, trapezius, and scapularis region to decrease fascial restrictions and increase joint mobility in a pain free zone.  Occupational Therapy Assessment and Plan OT Assessment and Plan Clinical Impression Statement: A:  Abduction AROM has improved since initial evaluation, all other AROM  has worsened.  Patient states that manual therapy feels good, however doesnt provide any long lasting relief from pain.  requested dc from OT and I agree as skilled OT intervention has not significantly improved her AROM or strength. or decreased pain. OT Plan: P:  DC from skilled OT intervention this date.     Goals Short Term Goals Short Term Goal 1: Patient will be educated on HEP.  Short Term Goal 1 Progress: Met Short Term Goal 2: Patient will increase PROM to [facility] to increase ability to donn shirts and dresses with less difficulty.  Short Term Goal 2 Progress: Progressing toward goal Short Term Goal 3: Patient will report a pain level of 6/10 with daily tasks.  Short Term Goal 3 Progress: Met Short Term Goal 4: Patient will decrease fascial restriction from max to mod amount.  Short Term Goal 4 Progress: Progressing toward goal Long Term Goals Long Term Goal 1: Patient will return to highest level of independence with all BADL and work tasks.  Long Term Goal 1 Progress: Progressing toward goal Long Term Goal 2: Patient will increase AROM to WNL to increase ability to reach hand to top of head with less difficulty.  Long Term Goal 2 Progress: Progressing toward goal Long Term Goal 3: Patient will report a pain level of 3/10 or less with daily tasks.  Long Term Goal 3 Progress: Progressing toward goal Long Term Goal 4: Patient will decrease fascial restrictions from mod to trace.  Long Term Goal 4 Progress: Progressing toward goal Long Term Goal 5: Patient will increase Rt shoulder strength to 5/5 to increase ability to pull self into cars etc with less difficulty.   Long Term Goal 5  Progress: Progressing toward goal  Problem List Patient Active Problem List   Diagnosis Date Noted  . Pain in joint, shoulder region 06/27/2013  . Muscle weakness (generalized) 06/27/2013  . 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 Avascular necrosis of humeral head  05/17/2013  . 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 Arthritis, shoulder region 05/17/2013  . Rotator cuff syndrome of right shoulder 05/17/2013    End of Session Activity Tolerance: Patient tolerated treatment well General Behavior During Therapy: [facility] for tasks assessed/performed OT Plan of Care OT Home Exercise Plan: red theraband exercises OT Patient Instructions: handout (scanned) Consulted and Agree with Plan of Care: Patient  [place], OTR/L [phone]  07/30/2013, 11:02 AM  Physician Documentation Your signature is required to indicate approval of the treatment plan as stated above.  Please sign and either send electronically or make a copy of this report for your files and return this physician signed original.  Please mark one 1.__approve of plan  2. ___approve of plan with the following conditions.   ______________________________                                                          _____________________ Physician Signature                                                                                                             Date SBNR

## 2013-08-16 ENCOUNTER — Ambulatory Visit (INDEPENDENT_AMBULATORY_CARE_PROVIDER_SITE_OTHER): Payer: BC Managed Care – PPO | Admitting: Orthopedic Surgery

## 2013-08-16 VITALS — Resp 18 | Ht <= 58 in | Wt 97.0 lb

## 2013-08-16 DIAGNOSIS — M67919 Unspecified disorder of synovium and tendon, unspecified shoulder: Secondary | ICD-10-CM

## 2013-08-16 DIAGNOSIS — M719 Bursopathy, unspecified: Secondary | ICD-10-CM

## 2013-08-16 DIAGNOSIS — M75101 Unspecified rotator cuff tear or rupture of right shoulder, not specified as traumatic: Secondary | ICD-10-CM

## 2013-08-16 DIAGNOSIS — M19019 Primary osteoarthritis, unspecified shoulder: Secondary | ICD-10-CM

## 2013-08-16 DIAGNOSIS — S43429A Sprain of unspecified rotator cuff capsule, initial encounter: Secondary | ICD-10-CM

## 2013-08-16 NOTE — Patient Instructions (Signed)
Mr scan right shoulder RCT

## 2013-08-17 ENCOUNTER — Encounter: Payer: Self-pay | Admitting: Orthopedic Surgery

## 2013-08-17 NOTE — Progress Notes (Signed)
Patient ID: Lisa Bush, female   DOB: Jun 02, 1970, 43 y.o.   MRN: [id number] Chief Complaint  Patient presents with  . Follow-up    Recheck right shoulder.   Followup visit right shoulder pain continues after injection and physical therapy and anti-inflammatories. She is non-any other medications that are new except for an antidepressant she takes 10 mg did note name she is allergic to morphine and Demerol  She complains of anterior joint line shoulder pain superior scapular pain and pain in her right trapezius  GEN an MRI in May of this year that shows spinal stenosis she will need neurosurgical evaluation which is in progress  [place] is on [place] way to [place] believe and will be out of the country for about a month  Review of systems negative for any other symptoms  Resp 18  Ht 3\' 6"  (1.067 m)  Wt 97 lb (43.999 kg)  BMI 38.65 kg/m2 She remained awake alert and oriented x3 her mood and affect are normal. Her body habitus is remarkable for poor physical. Just painful for elevation of the right shoulder and she lacks 40 of forward elevation compared to the left shows tenderness in the trapezius and periscapular muscles she has tenderness along the anterior joint line. She has weakness in the rotator cuff manual muscle testing reveals 4/5 strength in the supraspinatus 5 out of 5 strength in the infraspinatus and external rotators and 5 out of 5 strength in the subscapularis  She impinges  Neurovascular exam is intact  Encounter Diagnoses  Name Primary?  . Rotator cuff syndrome of right shoulder Yes  . Arthritis, shoulder region   . Rotator cuff tear, right     She is now failed nonoperative treatment for right shoulder pain. We need to find out if she needs a decompression or if she needs rotator cuff repair so we need an MRI of her right shoulder

## 2013-09-25 ENCOUNTER — Telehealth: Payer: Self-pay | Admitting: Orthopedic Surgery

## 2013-09-25 NOTE — Telephone Encounter (Signed)
PATIENT AWARE

## 2013-09-25 NOTE — Telephone Encounter (Signed)
Call that doctor and ask him to schedule it in [place]

## 2013-09-25 NOTE — Telephone Encounter (Signed)
Patient called following her referral appointment 09/21/13 with Dr Lamar Sprinkles at [facility] regarding her hip.  States she has her MRI tomorrow, 09/26/13, at [facility] and her next follow up appointment here 10/04/13.  She states she is scheduled for a hip injection / flourescein through Dr Lamar Sprinkles, at a facility in [place], and said she would like to have it done "closer to home."  I relayed that we may need the report regarding the specific information.  Please advise if this is something we can assist her with.     Patient ph# [phone]

## 2013-09-25 NOTE — Telephone Encounter (Signed)
Routing to dr harrison 

## 2013-09-26 ENCOUNTER — Ambulatory Visit (HOSPITAL_COMMUNITY)
Admission: RE | Admit: 2013-09-26 | Discharge: 2013-09-26 | Disposition: A | Discharge status: Home / Self Care | Payer: BC Managed Care – PPO | Source: Ambulatory Visit | Attending: Orthopedic Surgery | Admitting: Orthopedic Surgery

## 2013-09-26 DIAGNOSIS — M19019 Primary osteoarthritis, unspecified shoulder: Secondary | ICD-10-CM | POA: Insufficient documentation

## 2013-09-26 DIAGNOSIS — Q740 Other congenital malformations of upper limb(s), including shoulder girdle: Secondary | ICD-10-CM | POA: Insufficient documentation

## 2013-09-26 DIAGNOSIS — M25519 Pain in unspecified shoulder: Secondary | ICD-10-CM | POA: Insufficient documentation

## 2013-09-26 DIAGNOSIS — M75101 Unspecified rotator cuff tear or rupture of right shoulder, not specified as traumatic: Secondary | ICD-10-CM

## 2013-09-28 ENCOUNTER — Other Ambulatory Visit (HOSPITAL_COMMUNITY): Payer: Self-pay | Admitting: Orthopaedic Surgery

## 2013-09-28 DIAGNOSIS — M167 Other unilateral secondary osteoarthritis of hip: Secondary | ICD-10-CM

## 2013-09-28 DIAGNOSIS — M1611 Unilateral primary osteoarthritis, right hip: Secondary | ICD-10-CM

## 2013-10-02 ENCOUNTER — Encounter (HOSPITAL_COMMUNITY): Payer: Self-pay

## 2013-10-02 ENCOUNTER — Ambulatory Visit (HOSPITAL_COMMUNITY)
Admission: RE | Admit: 2013-10-02 | Discharge: 2013-10-02 | Disposition: A | Discharge status: Home / Self Care | Payer: BC Managed Care – PPO | Source: Ambulatory Visit | Attending: Orthopaedic Surgery | Admitting: Orthopaedic Surgery

## 2013-10-02 ENCOUNTER — Other Ambulatory Visit (HOSPITAL_COMMUNITY): Payer: Self-pay | Admitting: Orthopaedic Surgery

## 2013-10-02 DIAGNOSIS — M25559 Pain in unspecified hip: Secondary | ICD-10-CM | POA: Diagnosis not present

## 2013-10-02 DIAGNOSIS — M1611 Unilateral primary osteoarthritis, right hip: Secondary | ICD-10-CM

## 2013-10-02 DIAGNOSIS — M167 Other unilateral secondary osteoarthritis of hip: Secondary | ICD-10-CM

## 2013-10-02 MED ORDER — LIDOCAINE HCL (PF) 1 % IJ SOLN
INTRAMUSCULAR | Status: AC
  Filled 2013-10-02: qty 5

## 2013-10-02 MED ORDER — POVIDONE-IODINE 10 % EX SOLN
CUTANEOUS | Status: AC
  Filled 2013-10-02: qty 15

## 2013-10-02 MED ORDER — TRIAMCINOLONE ACETONIDE 40 MG/ML IJ SUSP (RADIOLOGY)
80.0000 mg | Freq: Once | INTRAMUSCULAR | Status: DC
  Filled 2013-10-02: qty 2

## 2013-10-04 ENCOUNTER — Ambulatory Visit (INDEPENDENT_AMBULATORY_CARE_PROVIDER_SITE_OTHER): Payer: BC Managed Care – PPO | Admitting: Orthopedic Surgery

## 2013-10-04 VITALS — Ht <= 58 in | Wt 96.0 lb

## 2013-10-04 DIAGNOSIS — Q740 Other congenital malformations of upper limb(s), including shoulder girdle: Secondary | ICD-10-CM | POA: Insufficient documentation

## 2013-10-04 DIAGNOSIS — M67919 Unspecified disorder of synovium and tendon, unspecified shoulder: Secondary | ICD-10-CM

## 2013-10-04 DIAGNOSIS — M75101 Unspecified rotator cuff tear or rupture of right shoulder, not specified as traumatic: Secondary | ICD-10-CM

## 2013-10-04 DIAGNOSIS — M19019 Primary osteoarthritis, unspecified shoulder: Secondary | ICD-10-CM

## 2013-10-04 DIAGNOSIS — M719 Bursopathy, unspecified: Secondary | ICD-10-CM

## 2013-10-04 NOTE — Progress Notes (Signed)
Chief Complaint  Patient presents with  . Results    MRI right shoulder results    Patient was able to undergo MRI of the right shoulder in review she has a form of cortisone. She's having shoulder pain. She had an injection. 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  She's also having difficulty with her hip she was seen at [facility] and they advised injection and they couldn't do the injection here at our hospital.  She is advised to seek care at a tertiary care facility and she knows Dr.AIN at [facility] and she would like [place] to contact him to see if he can help her. No followup necessary.

## 2013-10-04 NOTE — Patient Instructions (Signed)
We will be in touch with Dr Shon Baton

## 2014-06-03 ENCOUNTER — Other Ambulatory Visit (HOSPITAL_COMMUNITY): Payer: Self-pay | Admitting: Family Medicine

## 2014-06-03 DIAGNOSIS — Z1231 Encounter for screening mammogram for malignant neoplasm of breast: Secondary | ICD-10-CM

## 2014-07-15 ENCOUNTER — Ambulatory Visit (HOSPITAL_COMMUNITY)
Admission: RE | Admit: 2014-07-15 | Discharge: 2014-07-15 | Disposition: A | Discharge status: Home / Self Care | Payer: 59 | Source: Ambulatory Visit | Attending: Family Medicine | Admitting: Family Medicine

## 2014-07-15 DIAGNOSIS — Z1231 Encounter for screening mammogram for malignant neoplasm of breast: Secondary | ICD-10-CM | POA: Diagnosis not present

## 2015-11-16 IMAGING — CR DG AC JOINTS*L*
2 series · 2 of 2 positions shown · non-contrast
Comparison: None.

CLINICAL DATA: Chronic pain.  Diastrophic dysplasia.

EXAM:
LEFT ACROMIOCLAVICULAR JOINTS

[view not recorded (1 of 2)]
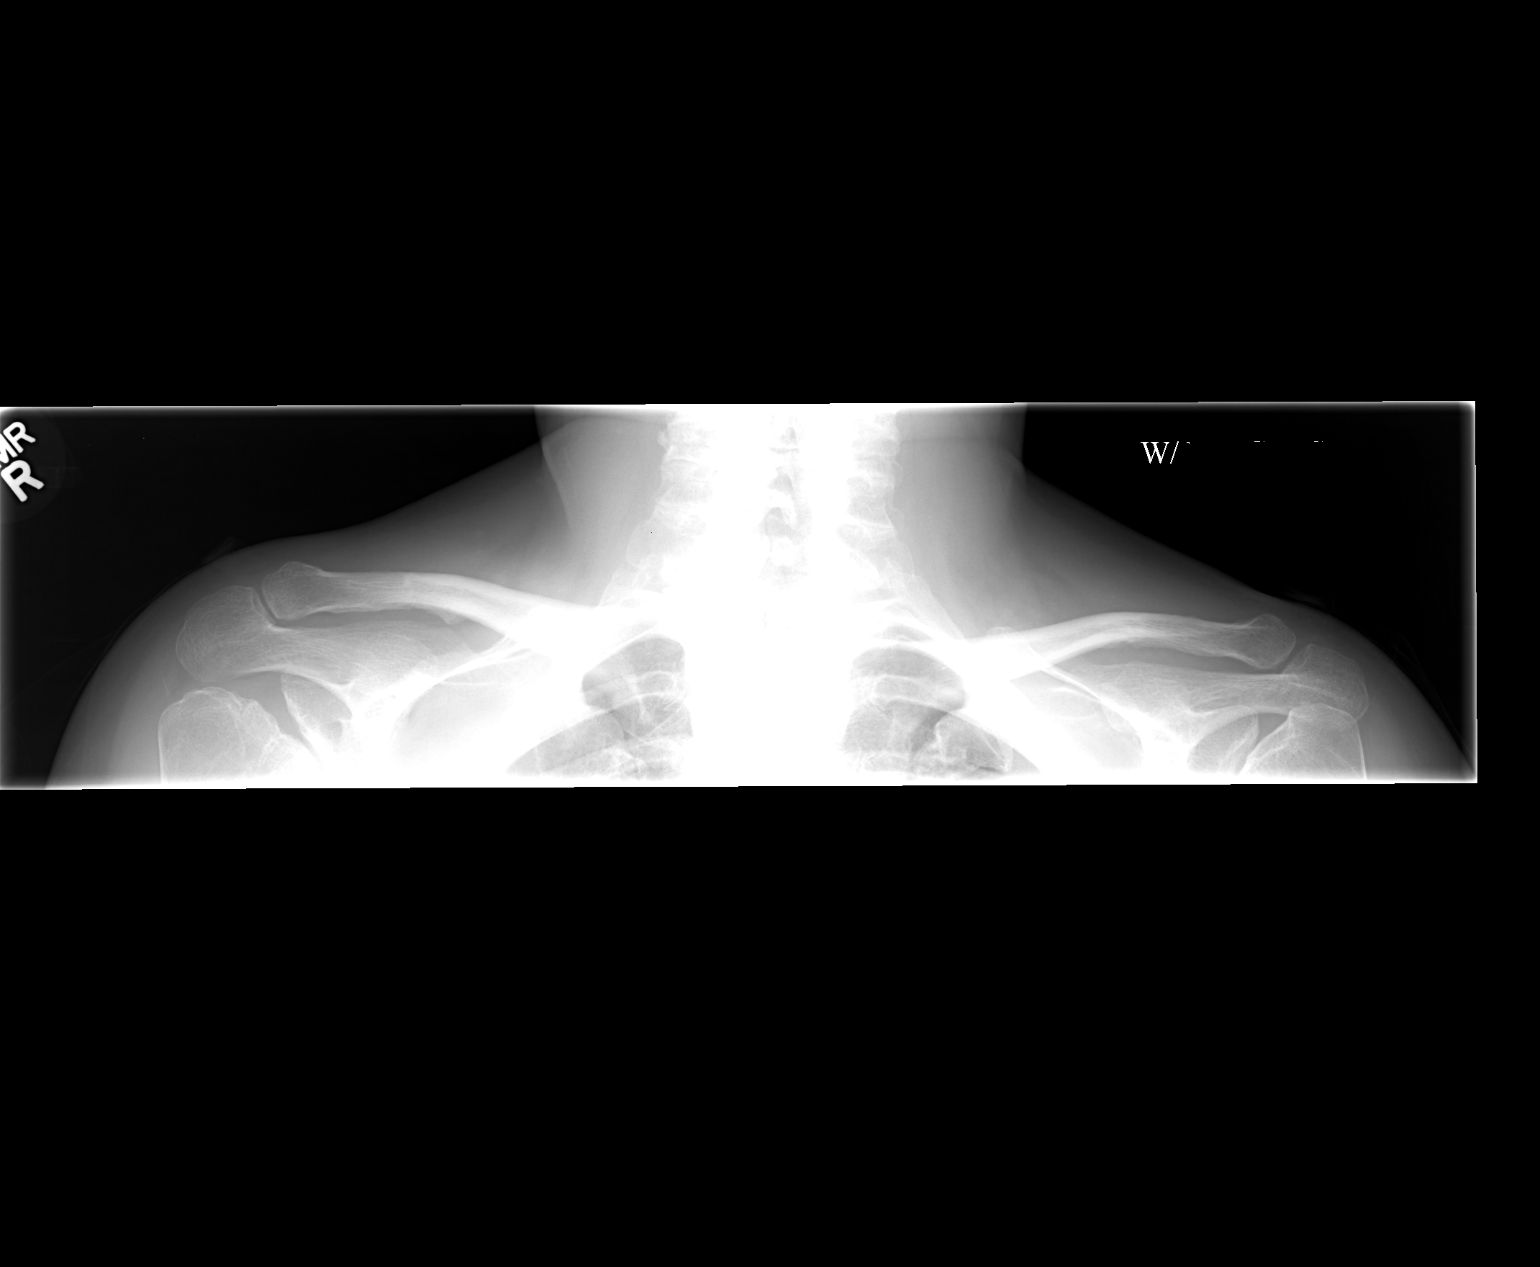

[view not recorded (2 of 2)]
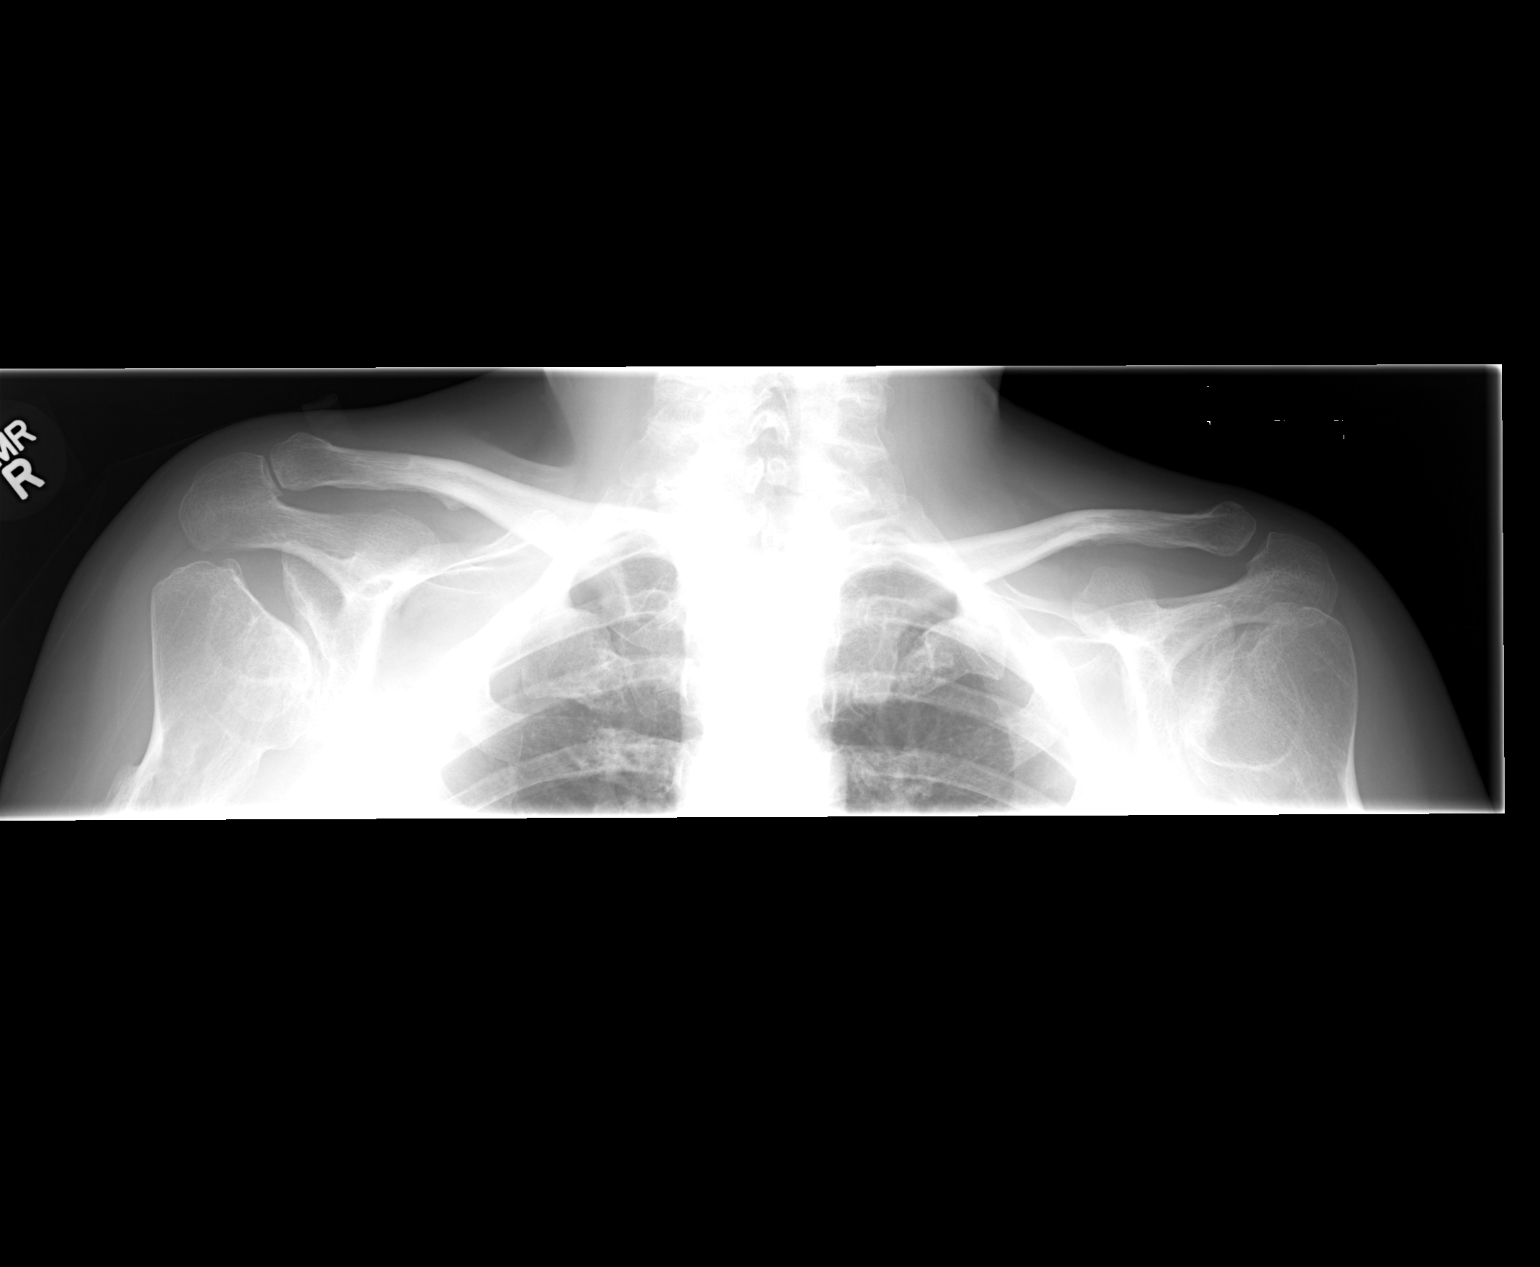

[2 of 2 positions shown; findings below may reference images not displayed]

FINDINGS: The acromioclavicular joints appear normal bilaterally. Deformities
of both humeral heads are noted consistent with diastrophic
dysplasia.
IMPRESSION: Normal acromioclavicular joints.
